# Patient Record
Sex: Female | Born: 1975 | Race: White | Hispanic: No | Marital: Married | Smoking: Never smoker
Health system: Southern US, Community
[De-identification: ages and names within clinical notes are randomized; demographics above are authoritative.]

---

## 2010-08-13 ENCOUNTER — Emergency Department (HOSPITAL_BASED_OUTPATIENT_CLINIC_OR_DEPARTMENT_OTHER)
Admission: EM | Admit: 2010-08-13 | Discharge: 2010-08-13 | Payer: Self-pay | Source: Home / Self Care | Admitting: Emergency Medicine

## 2012-08-07 ENCOUNTER — Ambulatory Visit (INDEPENDENT_AMBULATORY_CARE_PROVIDER_SITE_OTHER): Payer: BC Managed Care – PPO

## 2012-08-07 DIAGNOSIS — Z23 Encounter for immunization: Secondary | ICD-10-CM

## 2015-08-16 DIAGNOSIS — F32A Depression, unspecified: Secondary | ICD-10-CM | POA: Insufficient documentation

## 2015-08-16 DIAGNOSIS — F419 Anxiety disorder, unspecified: Secondary | ICD-10-CM | POA: Insufficient documentation

## 2015-09-27 ENCOUNTER — Emergency Department (HOSPITAL_BASED_OUTPATIENT_CLINIC_OR_DEPARTMENT_OTHER)
Admission: EM | Admit: 2015-09-27 | Discharge: 2015-09-27 | Disposition: A | Discharge status: Home / Self Care | Payer: BLUE CROSS/BLUE SHIELD | Attending: Emergency Medicine | Admitting: Emergency Medicine

## 2015-09-27 ENCOUNTER — Encounter (HOSPITAL_BASED_OUTPATIENT_CLINIC_OR_DEPARTMENT_OTHER): Payer: Self-pay | Admitting: *Deleted

## 2015-09-27 DIAGNOSIS — R509 Fever, unspecified: Secondary | ICD-10-CM | POA: Diagnosis present

## 2015-09-27 DIAGNOSIS — J069 Acute upper respiratory infection, unspecified: Secondary | ICD-10-CM | POA: Diagnosis not present

## 2015-09-27 LAB — CBC WITH DIFFERENTIAL/PLATELET
BASOS ABS: 0 10*3/uL (ref 0.0–0.1)
Basophils Relative: 0 %
EOS PCT: 2 %
Eosinophils Absolute: 0.1 10*3/uL (ref 0.0–0.7)
HCT: 37.7 % (ref 36.0–46.0)
HEMOGLOBIN: 12.7 g/dL (ref 12.0–15.0)
LYMPHS ABS: 0.7 10*3/uL (ref 0.7–4.0)
LYMPHS PCT: 16 %
MCH: 29.8 pg (ref 26.0–34.0)
MCHC: 33.7 g/dL (ref 30.0–36.0)
MCV: 88.5 fL (ref 78.0–100.0)
Monocytes Absolute: 0.5 10*3/uL (ref 0.1–1.0)
Monocytes Relative: 12 %
NEUTROS PCT: 70 %
Neutro Abs: 3.1 10*3/uL (ref 1.7–7.7)
PLATELETS: 178 10*3/uL (ref 150–400)
RBC: 4.26 MIL/uL (ref 3.87–5.11)
RDW: 12.4 % (ref 11.5–15.5)
WBC: 4.4 10*3/uL (ref 4.0–10.5)

## 2015-09-27 LAB — BASIC METABOLIC PANEL
ANION GAP: 9 (ref 5–15)
BUN: 9 mg/dL (ref 6–20)
CHLORIDE: 102 mmol/L (ref 101–111)
CO2: 25 mmol/L (ref 22–32)
Calcium: 8.6 mg/dL — ABNORMAL LOW (ref 8.9–10.3)
Creatinine, Ser: 0.72 mg/dL (ref 0.44–1.00)
GFR calc Af Amer: >60 mL/min (ref >60)
Glucose, Bld: 98 mg/dL (ref 65–99)
POTASSIUM: 3.7 mmol/L (ref 3.5–5.1)
SODIUM: 136 mmol/L (ref 135–145)

## 2015-09-27 MED ORDER — NAPROXEN 500 MG PO TABS: 500.0000 mg | ORAL_TABLET | Freq: Two times a day (BID) | ORAL | Status: DC

## 2015-09-27 MED ORDER — BENZONATATE 100 MG PO CAPS: 100.0000 mg | ORAL_CAPSULE | Freq: Three times a day (TID) | ORAL | Status: DC

## 2015-09-27 NOTE — ED Notes (Signed)
Rx x 2 given at d/c. Pt d/c home with family member to drive

## 2015-09-27 NOTE — Discharge Instructions (Signed)

## 2015-09-27 NOTE — ED Notes (Signed)
Fever, dizziness, and body aches since yesterday.

## 2015-09-27 NOTE — ED Provider Notes (Signed)
CSN: [id number]     Arrival date & time 09/27/15  1628 History   First MD Initiated Contact with Patient 09/27/15 1657     Chief Complaint  Patient presents with  . Fever   HPI Pt has been feeling sick since yesterday.  She has been coughing.  SOme bodyaches.  Mild  sore throat.  No vomiting.  No diarrhea.  While at work she felt very dizzy and had to leave work so she came to the ED. History reviewed. No pertinent past medical history. Past Surgical History  Procedure Laterality Date  . Cesarean section     No family history on file. Social History  Substance Use Topics  . Smoking status: Never Smoker   . Smokeless tobacco: None  . Alcohol Use: No   OB History    No data available     Review of Systems  Constitutional: Negative for fever.  HENT: Positive for ear pain.   Respiratory: Negative for shortness of breath.   Endocrine: Negative for polydipsia.  Genitourinary: Negative for dysuria.       Nl menses  Neurological: Negative for syncope.  All other systems reviewed and are negative.     Allergies  Review of patient's allergies indicates no known allergies.  Home Medications   Prior to Admission medications   Medication Sig Start Date End Date Taking? Authorizing Provider  benzonatate (TESSALON) 100 MG capsule Take 1 capsule (100 mg total) by mouth every 8 (eight) hours. 09/27/15   Linwood DibblesJon Kahli Mayon, MD  naproxen (NAPROSYN) 500 MG tablet Take 1 tablet (500 mg total) by mouth 2 (two) times daily. 09/27/15   Linwood DibblesJon Kayzlee Wirtanen, MD   BP 150/92 mmHg  Pulse 106  Temp(Src) 99.5 F (37.5 C) (Oral)  Resp 18  Ht 5\' 3"  (1.6 m)  Wt 77.111 kg  BMI 30.12 kg/m2  SpO2 99%  LMP 08/30/2015 Physical Exam  Constitutional: She appears well-developed and well-nourished. No distress.  HENT:  Head: Normocephalic and atraumatic.  Right Ear: Tympanic membrane and external ear normal.  Left Ear: Tympanic membrane and external ear normal.  Eyes: Conjunctivae are normal. Right eye exhibits no  discharge. Left eye exhibits no discharge. No scleral icterus.  Neck: Neck supple. No tracheal deviation present.  Cardiovascular: Normal rate, regular rhythm and intact distal pulses.   Pulmonary/Chest: Effort normal and breath sounds normal. No stridor. No respiratory distress. She has no wheezes. She has no rales.  Abdominal: Soft. Bowel sounds are normal. She exhibits no distension. There is no tenderness. There is no rebound and no guarding.  Musculoskeletal: She exhibits no edema or tenderness.  Neurological: She is alert. She has normal strength. No cranial nerve deficit (no facial droop, extraocular movements intact, no slurred speech) or sensory deficit. She exhibits normal muscle tone. She displays no seizure activity. Coordination normal.  Skin: Skin is warm and dry. No rash noted.  Psychiatric: She has a normal mood and affect.  Nursing note and vitals reviewed.   ED Course  Procedures (including critical care time) Labs Review Labs Reviewed  BASIC METABOLIC PANEL - Abnormal; Notable for the following:    Calcium 8.6 (*)    All other components within normal limits  CBC WITH DIFFERENTIAL/PLATELET     MDM   Final diagnoses:  URI, acute   Labs are normal. Symptoms are consistent with a simple upper respiratory infection. There is no evidence to suggest pneumonia on my exam. The patient does not appear to have an otitis media. I  discussed supportive treatment. I encouraged followup with the primary care doctor next week if symptoms have not resolved. Warning signs and reasons to return to the emergency room were discussed     Linwood Dibbles, MD 09/27/15 [phone]

## 2016-03-07 ENCOUNTER — Encounter: Payer: Self-pay | Admitting: *Deleted

## 2019-08-05 ENCOUNTER — Other Ambulatory Visit: Payer: Self-pay | Admitting: Family Medicine

## 2019-08-05 DIAGNOSIS — Z1231 Encounter for screening mammogram for malignant neoplasm of breast: Secondary | ICD-10-CM

## 2019-09-15 ENCOUNTER — Ambulatory Visit
Admission: RE | Admit: 2019-09-15 | Discharge: 2019-09-15 | Disposition: A | Discharge status: Home / Self Care | Payer: BC Managed Care – PPO | Source: Ambulatory Visit | Attending: Family Medicine | Admitting: Family Medicine

## 2019-09-15 ENCOUNTER — Other Ambulatory Visit: Payer: Self-pay

## 2019-09-15 DIAGNOSIS — Z1231 Encounter for screening mammogram for malignant neoplasm of breast: Secondary | ICD-10-CM

## 2021-06-26 ENCOUNTER — Other Ambulatory Visit: Payer: Self-pay | Admitting: Family Medicine

## 2021-06-26 DIAGNOSIS — Z1231 Encounter for screening mammogram for malignant neoplasm of breast: Secondary | ICD-10-CM

## 2021-07-03 DIAGNOSIS — H903 Sensorineural hearing loss, bilateral: Secondary | ICD-10-CM | POA: Insufficient documentation

## 2021-09-04 ENCOUNTER — Ambulatory Visit
Admission: RE | Admit: 2021-09-04 | Discharge: 2021-09-04 | Disposition: A | Discharge status: Home / Self Care | Payer: BC Managed Care – PPO | Source: Ambulatory Visit | Attending: Family Medicine | Admitting: Family Medicine

## 2021-09-04 DIAGNOSIS — Z1231 Encounter for screening mammogram for malignant neoplasm of breast: Secondary | ICD-10-CM

## 2021-09-05 ENCOUNTER — Other Ambulatory Visit: Payer: Self-pay | Admitting: Family Medicine

## 2021-09-05 DIAGNOSIS — R928 Other abnormal and inconclusive findings on diagnostic imaging of breast: Secondary | ICD-10-CM

## 2021-09-23 ENCOUNTER — Ambulatory Visit: Payer: BC Managed Care – PPO

## 2021-09-23 ENCOUNTER — Ambulatory Visit
Admission: RE | Admit: 2021-09-23 | Discharge: 2021-09-23 | Disposition: A | Discharge status: Home / Self Care | Payer: BC Managed Care – PPO | Source: Ambulatory Visit | Attending: Family Medicine | Admitting: Family Medicine

## 2021-09-23 ENCOUNTER — Other Ambulatory Visit: Payer: Self-pay

## 2021-09-23 DIAGNOSIS — R928 Other abnormal and inconclusive findings on diagnostic imaging of breast: Secondary | ICD-10-CM

## 2022-02-14 ENCOUNTER — Ambulatory Visit: Payer: BC Managed Care – PPO | Admitting: Podiatry

## 2022-02-14 ENCOUNTER — Ambulatory Visit: Payer: BC Managed Care – PPO

## 2022-02-14 DIAGNOSIS — Q666 Other congenital valgus deformities of feet: Secondary | ICD-10-CM

## 2022-02-14 DIAGNOSIS — M779 Enthesopathy, unspecified: Secondary | ICD-10-CM | POA: Diagnosis not present

## 2022-02-14 DIAGNOSIS — M2012 Hallux valgus (acquired), left foot: Secondary | ICD-10-CM | POA: Diagnosis not present

## 2022-02-20 ENCOUNTER — Encounter: Payer: Self-pay | Admitting: Podiatry

## 2022-02-20 NOTE — Progress Notes (Signed)
Subjective:  Patient ID: [facility], female    DOB: 22-Jul-1975,  MRN: [id number]  Chief Complaint  Patient presents with   Foot Pain    Left foot pain pain on the top of foot and side been going on for few years throbbing type pain     46 y.o. female presents with the above complaint.  Patient presents with complaint left bunion deformity.  Patient states the pain is on top of the bump as well as the side of the bone.  Throbbing pain is going for fevers is progressive gotten worse she wanted get it evaluated she has not seen anyone as prior to seeing me pain scale is 5 out of 10 it is manageable she would like to discuss treatment options she has tried shoe gear modification padding protected none of which has helped.  She would like to discuss surgical options and think about them.  Review of Systems: Negative except as noted in the HPI. Denies N/V/F/Ch.  No past medical history on file.  Current Outpatient Medications:    sertraline (ZOLOFT) 50 MG tablet, 1/2 tablet by mouth once daily for 6 days, then 1 tablet by mouth daily, Disp: , Rfl:    benzonatate (TESSALON) 100 MG capsule, Take 1 capsule (100 mg total) by mouth every 8 (eight) hours., Disp: 21 capsule, Rfl: 0   naproxen (NAPROSYN) 500 MG tablet, Take 1 tablet (500 mg total) by mouth 2 (two) times daily., Disp: 30 tablet, Rfl: 0  Social History   Tobacco Use  Smoking Status Never  Smokeless Tobacco Not on file    No Known Allergies Objective:  There were no vitals filed for this visit. There is no height or weight on file to calculate BMI. Constitutional Well developed. Well nourished.  Vascular Dorsalis pedis pulses palpable bilaterally. Posterior tibial pulses palpable bilaterally. Capillary refill normal to all digits.  No cyanosis or clubbing noted. Pedal hair growth normal.  Neurologic Normal speech. Oriented to person, place, and time. Epicritic sensation to light touch grossly present bilaterally.   Dermatologic Nails well groomed and normal in appearance. No open wounds. No skin lesions.  Orthopedic: Normal joint ROM without pain or crepitus bilaterally. Hallux abductovalgus deformity present Left 1st MPJ diminished range of motion. Left 1st TMT without gross hypermobility. Right 1st MPJ diminished range of motion  Right 1st TMT without gross hypermobility. Lesser digital contractures absent bilaterally.   Radiographs: Taken and reviewed. Hallux abductovalgus deformity present. Metatarsal parabola normal. 1st/2nd IMA: Moderate 14 degrees; TSP: Sesamoid position is 4 out of 7.  There is increasing hallux valgus angle no other bony abnormalities identified  Assessment:   1. Hav (hallux abducto valgus), left   2. Pes planovalgus    Plan:  Patient was evaluated and treated and all questions answered.  Hallux abductovalgus deformity, left -XR as above. -I discussed with the patient all the conservative treatment options include shoe gear modification orthotics management padding protecting.  I discussed orthotics she will benefit from orthotics.  Also discussed surgical options as well.  She would like to think about the surgery and get back to me when she is ready.   Pes planovalgus -I explained to patient the etiology of pes planovalgus and relationship with Planter fasciitis and various treatment options were discussed.  Given patient foot structure in the setting of Planter fasciitis I believe patient will benefit from custom-made orthotics to help control the hindfoot motion support the arch of the foot and take the stress away  from plantar fascial.  Patient agrees with the plan like to proceed with orthotics -Patient was casted for orthotics   No follow-ups on file.

## 2022-03-04 ENCOUNTER — Telehealth: Payer: Self-pay | Admitting: Podiatry

## 2022-03-04 NOTE — Telephone Encounter (Signed)
LEFT VOICEMAIL FOR PT TO [facility]

## 2022-03-13 ENCOUNTER — Ambulatory Visit (INDEPENDENT_AMBULATORY_CARE_PROVIDER_SITE_OTHER): Payer: BC Managed Care – PPO | Admitting: Podiatry

## 2022-03-13 DIAGNOSIS — Q666 Other congenital valgus deformities of feet: Secondary | ICD-10-CM

## 2022-03-13 NOTE — Progress Notes (Signed)
Patient presents today to pick up custom molded foot orthotics recommended by Dr. Allena Katz.   Orthotics were dispensed and fit was satisfactory. Reviewed instructions for break-in and wear. Written instructions given to patient.  Patient will follow up as needed.   [facility] - order # [id number]

## 2022-03-28 ENCOUNTER — Ambulatory Visit: Payer: BC Managed Care – PPO | Admitting: Podiatry

## 2022-07-22 ENCOUNTER — Other Ambulatory Visit: Payer: Self-pay | Admitting: Family Medicine

## 2022-07-22 DIAGNOSIS — Z1231 Encounter for screening mammogram for malignant neoplasm of breast: Secondary | ICD-10-CM

## 2022-09-11 ENCOUNTER — Ambulatory Visit
Admission: RE | Admit: 2022-09-11 | Discharge: 2022-09-11 | Disposition: A | Discharge status: Home / Self Care | Payer: BC Managed Care – PPO | Source: Ambulatory Visit | Attending: Family Medicine | Admitting: Family Medicine

## 2022-09-11 DIAGNOSIS — Z1231 Encounter for screening mammogram for malignant neoplasm of breast: Secondary | ICD-10-CM

## 2022-09-15 ENCOUNTER — Other Ambulatory Visit: Payer: Self-pay | Admitting: Family Medicine

## 2022-09-15 DIAGNOSIS — R928 Other abnormal and inconclusive findings on diagnostic imaging of breast: Secondary | ICD-10-CM

## 2022-09-26 ENCOUNTER — Ambulatory Visit
Admission: RE | Admit: 2022-09-26 | Discharge: 2022-09-26 | Disposition: A | Discharge status: Home / Self Care | Payer: BC Managed Care – PPO | Source: Ambulatory Visit | Attending: Family Medicine | Admitting: Family Medicine

## 2022-09-26 DIAGNOSIS — R928 Other abnormal and inconclusive findings on diagnostic imaging of breast: Secondary | ICD-10-CM

## 2023-04-05 IMAGING — MG MM DIGITAL DIAGNOSTIC UNILAT*R* W/ TOMO W/ CAD
4 series · 4 of 12 positions shown · non-contrast
Comparison: Previous exam(s).

CLINICAL DATA: 45-year-old female recalled from screening mammogram
dated 09/04/2021 for a possible right breast asymmetry.

EXAM:
DIGITAL DIAGNOSTIC UNILATERAL RIGHT MAMMOGRAM WITH TOMOSYNTHESIS AND
CAD
TECHNIQUE: Right digital diagnostic mammography and breast tomosynthesis was
performed. The images were evaluated with computer-aided detection.

[R MLO synth-2D]
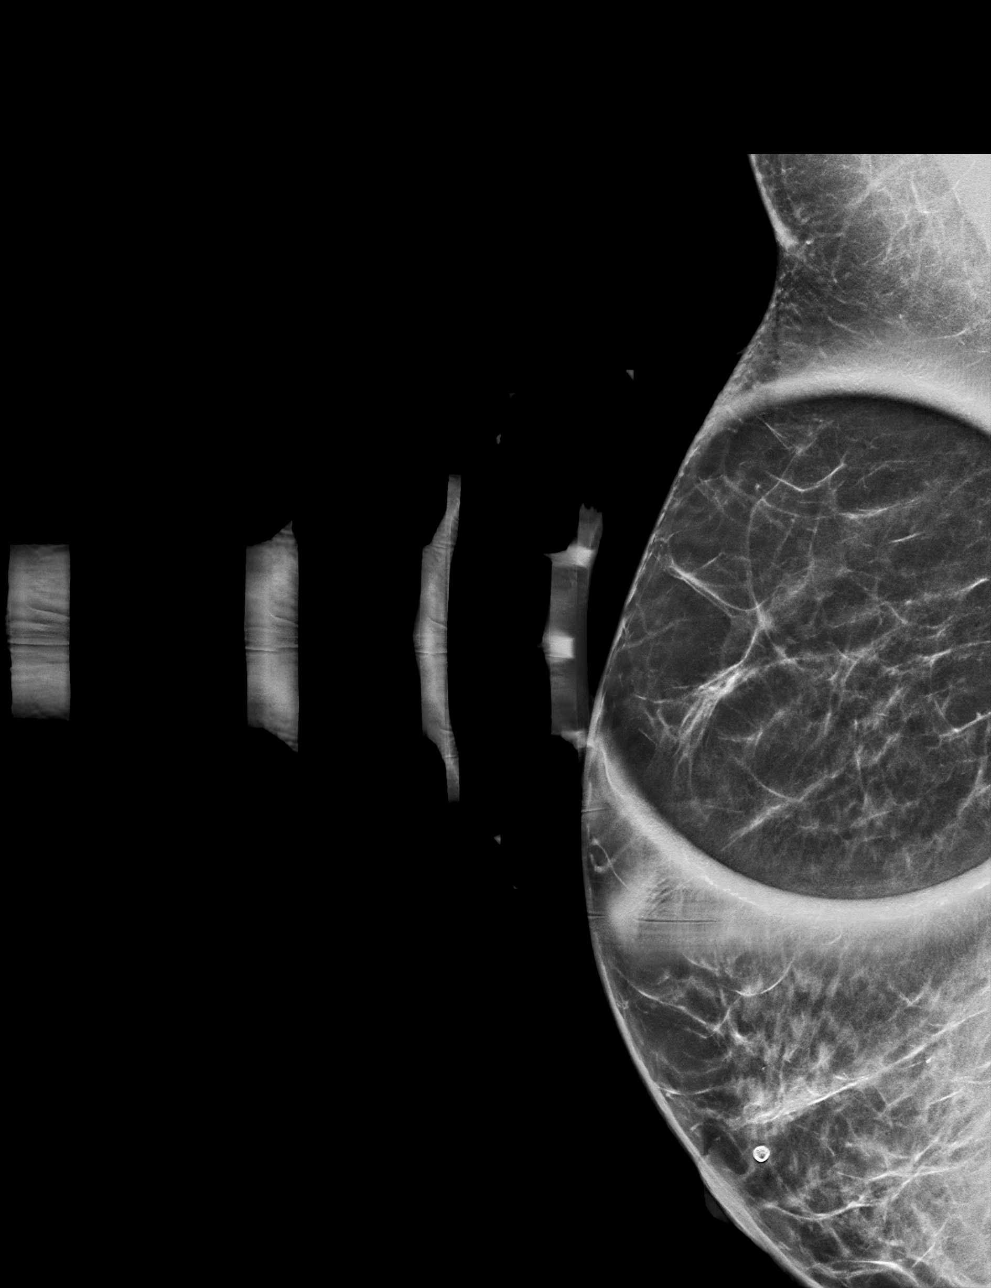

[R ML synth-2D]
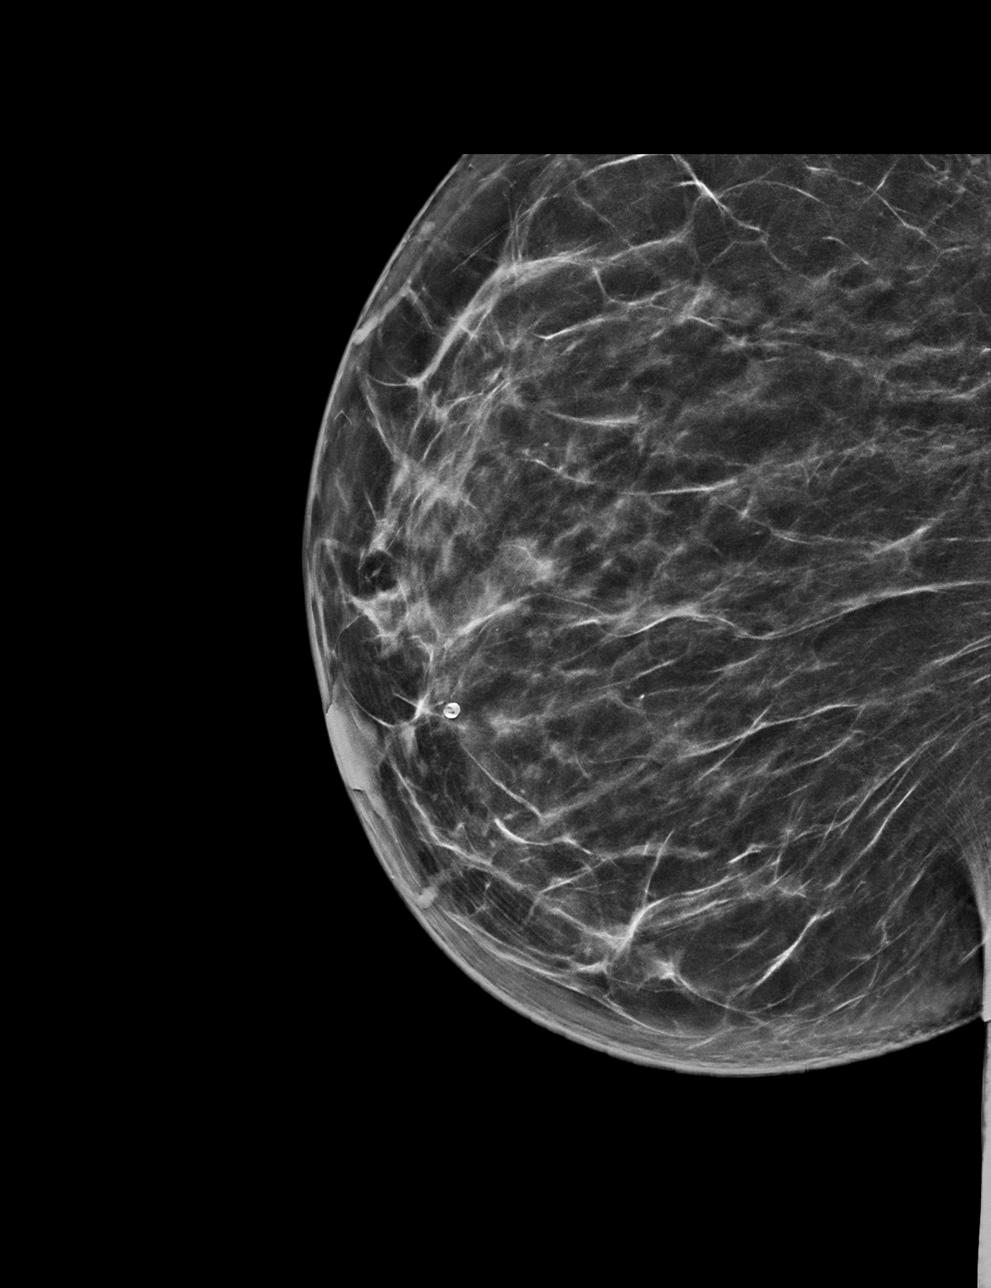

[R ML tomo · tomo slice 39/76.0]
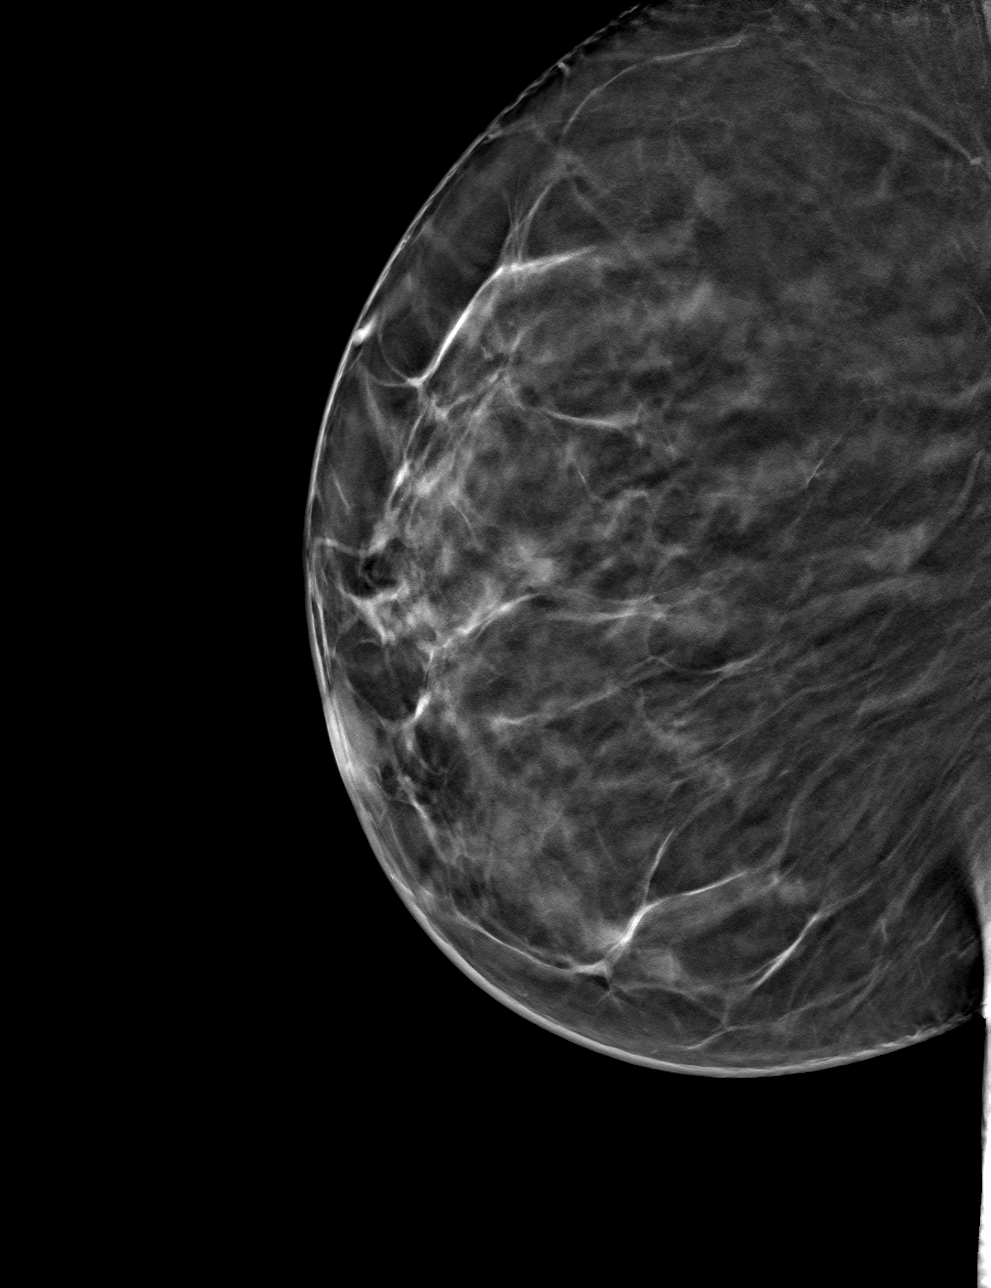

[R MLO tomo · tomo slice 30/59.0]
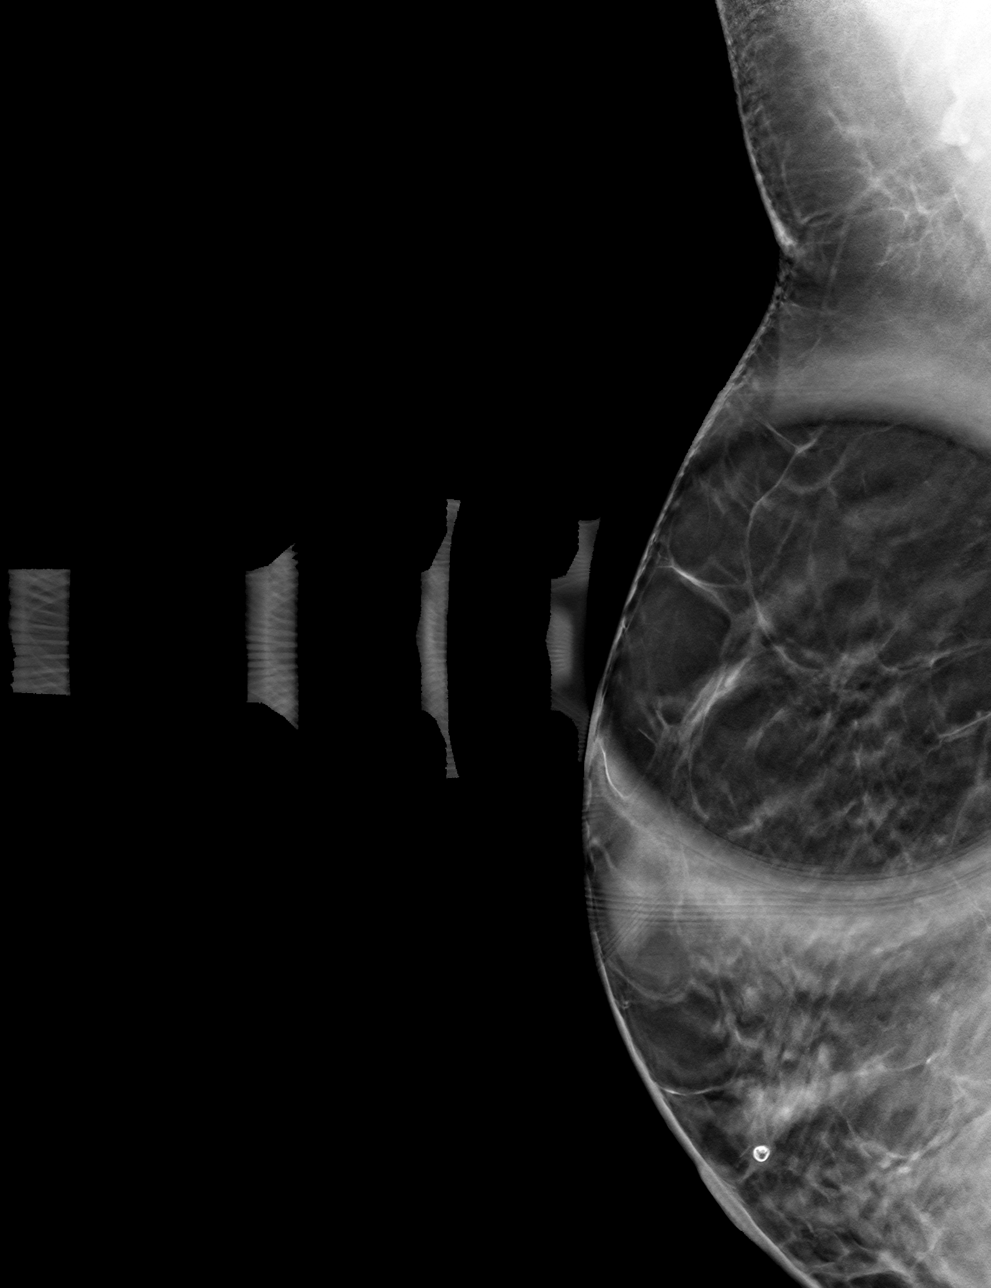

[4 of 12 positions shown; findings below may reference images not displayed]

ACR Breast Density Category b: There are scattered areas of
fibroglandular density.
FINDINGS: An asymmetry in the upper right breast at mid depth is seen on the
MLO projection resolves into well dispersed fibroglandular tissue on
additional views. No suspicious findings are identified.
IMPRESSION: No mammographic evidence of malignancy.

RECOMMENDATION:
Screening mammogram in one year.(Code:PC-P-A2P)

I have discussed the findings and recommendations with the patient.
If applicable, a reminder letter will be sent to the patient
regarding the next appointment.

BI-RADS CATEGORY  1: Negative.

## 2023-04-23 ENCOUNTER — Encounter: Payer: Self-pay | Admitting: Adult Health

## 2023-04-23 ENCOUNTER — Ambulatory Visit: Payer: BC Managed Care – PPO | Admitting: Adult Health

## 2023-04-23 VITALS — BP 123/79 | HR 78 | Ht 63.0 in | Wt 185.0 lb

## 2023-04-23 DIAGNOSIS — F331 Major depressive disorder, recurrent, moderate: Secondary | ICD-10-CM

## 2023-04-23 DIAGNOSIS — F411 Generalized anxiety disorder: Secondary | ICD-10-CM

## 2023-04-23 MED ORDER — SERTRALINE HCL 50 MG PO TABS: 50.0000 mg | ORAL_TABLET | Freq: Every day | ORAL | 2 refills | Status: AC

## 2023-04-23 NOTE — Progress Notes (Signed)
[facility] MD/PA/NP Initial Note  04/23/2023 4:59 PM TAMEAKA EICHHORN  MRN:  [id number]  Chief Complaint:   HPI:   Patient seen today for initial psychiatric evaluation.   Self referral  Describes mood today as - Reports tearfulness at times. Mood symptoms - reports depression, anxiety, and irritability - "comes and goes". Reports panic attacks. Reports low interest and motivation. Reports worry, rumination and over thinking. Denies obsessive thoughts and acts, but likes things in order. Mood is variable - "feelings fluctuate". Stating "I feel like I'm struggling". Reports struggling with mood symptoms throughout her life without resolution. Reports taking Zoloft years ago, but does not remember if it was helpful or not. She is willing to consider other options. Taking medications as prescribed.  Energy levels lower. Active, does not have a regular exercise routine.    Enjoys some usual interests and activities. Married x 22 years. Has 3 children and 1 stepchild - 2 dogs. Has family local. Spending time with family. Appetite adequate. Weight stable. Reports getting to to bed late. Averages 6 hours or less a night. Focus and concentration difficulties. Completing tasks. Managing aspects of household. Works full time IT trainer. Denies SI or HI.  Denies AH or VH. Denies self harm. Denies substance use. Denies alcohol use.  Previous medication trials: Zoloft  Visit Diagnosis:    ICD-10-CM   1. Major depressive disorder, recurrent episode, moderate (HCC)  F33.1     2. Generalized anxiety disorder  F41.1       Past Psychiatric History: Denies psychiatric hospitalization.   Past Medical History: No past medical history on file.  Past Surgical History:  Procedure Laterality Date   CESAREAN SECTION      Family Psychiatric History: Family history of mental illness - brother bipolar.   Family History: No family history on file.  Social History:  Social History    Socioeconomic History   Marital status: Married    Spouse name: Not on file   Number of children: Not on file   Years of education: Not on file   Highest education level: Not on file  Occupational History   Not on file  Tobacco Use   Smoking status: Never   Smokeless tobacco: Not on file  Substance and Sexual Activity   Alcohol use: No   Drug use: No   Sexual activity: Not on file  Other Topics Concern   Not on file  Social History Narrative   Not on file   Social Determinants of Health   Financial Resource Strain: Not on file  Food Insecurity: Not on file  Transportation Needs: Not on file  Physical Activity: Not on file  Stress: Not on file  Social Connections: Not on file    Allergies: No Known Allergies  Metabolic Disorder Labs: No results found for: "HGBA1C", "MPG" No results found for: "PROLACTIN" No results found for: "CHOL", "TRIG", "HDL", "CHOLHDL", "VLDL", "LDLCALC" No results found for: "TSH"  Therapeutic Level Labs: No results found for: "LITHIUM" No results found for: "VALPROATE" No results found for: "CBMZ"  Current Medications: Current Outpatient Medications  Medication Sig Dispense Refill   sertraline (ZOLOFT) 50 MG tablet Take 1 tablet (50 mg total) by mouth daily. 30 tablet 2   No current facility-administered medications for this visit.    Medication Side Effects: none  Orders placed this visit:  No orders of the defined types were placed in this encounter.   Psychiatric Specialty Exam:  Review of Systems  Musculoskeletal:  Negative  for gait problem.  Neurological:  Negative for tremors.  Psychiatric/Behavioral:         Please refer to HPI    There were no vitals taken for this visit.There is no height or weight on file to calculate BMI.  General Appearance: Casual and Neat  Eye Contact:  Good  Speech:  Clear and Coherent and Normal Rate  Volume:  Normal  Mood:  Euthymic  Affect:  Appropriate and Congruent  Thought Process:   Coherent and Descriptions of Associations: Intact  Orientation:  Full (Time, Place, and Person)  Thought Content: Logical   Suicidal Thoughts:  No  Homicidal Thoughts:  No  Memory:  WNL  Judgement:  Good  Insight:  Good  Psychomotor Activity:  Normal  Concentration:  Concentration: Good and Attention Span: Good  Recall:  Good  Fund of Knowledge: Good  Language: Good  Assets:  Communication Skills Desire for Improvement Financial Resources/Insurance Housing Intimacy Leisure Time Physical Health Resilience Social Support Talents/Skills Transportation Vocational/Educational  ADL's:  Intact  Cognition: WNL  Prognosis:  Good   Screenings: MDQ  Receiving Psychotherapy: No   Treatment Plan/Recommendations:  Plan:  PDMP reviewed  Add Zoloft 50mg  daily  RTC 4 weeks  Patient advised to contact office with any questions, adverse effects, or acute worsening in signs and symptoms.      Dorothyann Gibbs, NP

## 2023-05-21 ENCOUNTER — Ambulatory Visit: Payer: BC Managed Care – PPO | Admitting: Adult Health

## 2023-07-08 ENCOUNTER — Other Ambulatory Visit: Payer: Self-pay | Admitting: Family Medicine

## 2023-07-08 DIAGNOSIS — Z1231 Encounter for screening mammogram for malignant neoplasm of breast: Secondary | ICD-10-CM

## 2023-07-12 ENCOUNTER — Other Ambulatory Visit: Payer: Self-pay | Admitting: Adult Health

## 2023-09-25 ENCOUNTER — Ambulatory Visit
Admission: RE | Admit: 2023-09-25 | Discharge: 2023-09-25 | Disposition: A | Discharge status: Home / Self Care | Payer: BC Managed Care – PPO | Source: Ambulatory Visit | Attending: Family Medicine | Admitting: Family Medicine

## 2023-09-25 DIAGNOSIS — Z1231 Encounter for screening mammogram for malignant neoplasm of breast: Secondary | ICD-10-CM

## 2024-01-07 ENCOUNTER — Encounter: Payer: Self-pay | Admitting: Family Medicine

## 2024-01-07 DIAGNOSIS — Z1231 Encounter for screening mammogram for malignant neoplasm of breast: Secondary | ICD-10-CM

## 2024-07-18 ENCOUNTER — Other Ambulatory Visit: Payer: Self-pay | Admitting: Nurse Practitioner

## 2024-07-18 DIAGNOSIS — Z1231 Encounter for screening mammogram for malignant neoplasm of breast: Secondary | ICD-10-CM

## 2024-10-13 ENCOUNTER — Ambulatory Visit
